# Patient Record
Sex: Female | Born: 1990 | Race: Black or African American | Hispanic: No | Marital: Single | Smoking: Never smoker
Health system: Southern US, Community
[De-identification: ages and names within clinical notes are randomized; demographics above are authoritative.]

## PROBLEM LIST (undated history)

## (undated) DIAGNOSIS — R87629 Unspecified abnormal cytological findings in specimens from vagina: Secondary | ICD-10-CM

## (undated) HISTORY — PX: NO PAST SURGERIES: SHX2092

---
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
## 2008-08-10 ENCOUNTER — Inpatient Hospital Stay (HOSPITAL_COMMUNITY): Admission: AD | Admit: 2008-08-10 | Discharge: 2008-08-10 | Payer: Self-pay | Admitting: Obstetrics & Gynecology

## 2011-07-28 LAB — GC/CHLAMYDIA PROBE AMP, GENITAL
Chlamydia, DNA Probe: NEGATIVE
GC Probe Amp, Genital: NEGATIVE

## 2011-07-28 LAB — WET PREP, GENITAL
Clue Cells Wet Prep HPF POC: NONE SEEN
Trich, Wet Prep: NONE SEEN

## 2011-07-28 LAB — ABO/RH: ABO/RH(D): A NEG

## 2011-07-28 LAB — URINALYSIS, ROUTINE W REFLEX MICROSCOPIC
Glucose, UA: NEGATIVE
Protein, ur: NEGATIVE
Specific Gravity, Urine: >1.03 — ABNORMAL HIGH
pH: 6

## 2011-07-28 LAB — HCG, QUANTITATIVE, PREGNANCY: hCG, Beta Chain, Quant, S: 99853 — ABNORMAL HIGH

## 2011-07-28 LAB — RH IMMUNE GLOBULIN WORKUP (NOT WOMEN'S HOSP)

## 2011-08-05 LAB — WET PREP, GENITAL
Trich, Wet Prep: NONE SEEN
Yeast Wet Prep HPF POC: NONE SEEN

## 2011-08-05 LAB — GC/CHLAMYDIA PROBE AMP, GENITAL: GC Probe Amp, Genital: NEGATIVE

## 2019-10-28 NOTE — L&D Delivery Note (Signed)
DELIVERY NOTE  Pt complete and at +2 station with urge to push. Epidural controlling pain. Pt pushed and delivered a viable female infant in ROA position. Anterior and posterior shoulders spontaneously delivered with next two pushes; body easily followed next. Infant placed on mothers abdomen and bulb suction of mouth and nose performed. Body cord delivered through. Cord was then clamped and cut by FOB. Cord blood obtained, 3VC. Baby had a vigorous spontaneous cry noted. Placenta then delivered at 1255 intact. Fundal massage performed and pitocin per protocol. Fundus firm. The following lacerations were noted: 1st degree. Repaired in routine fashion with 2-0 vicryl (BL periurethral abrasions, hemostatic) Mother and baby stable. Counts correct   Infant time: 1251 Gender: female Placenta time: 1255 Apgars: 9/9 Weight: pending skin-to-skin

## 2020-02-29 ENCOUNTER — Encounter (HOSPITAL_COMMUNITY): Payer: Self-pay | Admitting: *Deleted

## 2020-02-29 ENCOUNTER — Observation Stay (HOSPITAL_COMMUNITY)
Admission: AD | Admit: 2020-02-29 | Discharge: 2020-03-01 | Disposition: A | Discharge status: Home / Self Care | Payer: No Typology Code available for payment source | Attending: Obstetrics and Gynecology | Admitting: Obstetrics and Gynecology

## 2020-02-29 ENCOUNTER — Other Ambulatory Visit: Payer: Self-pay

## 2020-02-29 ENCOUNTER — Inpatient Hospital Stay (HOSPITAL_BASED_OUTPATIENT_CLINIC_OR_DEPARTMENT_OTHER): Payer: No Typology Code available for payment source

## 2020-02-29 DIAGNOSIS — O3412 Maternal care for benign tumor of corpus uteri, second trimester: Secondary | ICD-10-CM | POA: Insufficient documentation

## 2020-02-29 DIAGNOSIS — O479 False labor, unspecified: Secondary | ICD-10-CM

## 2020-02-29 DIAGNOSIS — O99891 Other specified diseases and conditions complicating pregnancy: Secondary | ICD-10-CM

## 2020-02-29 DIAGNOSIS — O9A219 Injury, poisoning and certain other consequences of external causes complicating pregnancy, unspecified trimester: Principal | ICD-10-CM | POA: Insufficient documentation

## 2020-02-29 DIAGNOSIS — Z3A26 26 weeks gestation of pregnancy: Secondary | ICD-10-CM | POA: Diagnosis not present

## 2020-02-29 DIAGNOSIS — O9A212 Injury, poisoning and certain other consequences of external causes complicating pregnancy, second trimester: Secondary | ICD-10-CM | POA: Diagnosis not present

## 2020-02-29 DIAGNOSIS — Z20822 Contact with and (suspected) exposure to covid-19: Secondary | ICD-10-CM | POA: Insufficient documentation

## 2020-02-29 DIAGNOSIS — T1490XA Injury, unspecified, initial encounter: Secondary | ICD-10-CM

## 2020-02-29 DIAGNOSIS — D259 Leiomyoma of uterus, unspecified: Secondary | ICD-10-CM

## 2020-02-29 LAB — TYPE AND SCREEN
ABO/RH(D): A NEG
Antibody Screen: NEGATIVE

## 2020-02-29 LAB — URINALYSIS, ROUTINE W REFLEX MICROSCOPIC
Bilirubin Urine: NEGATIVE
Glucose, UA: NEGATIVE mg/dL
Hgb urine dipstick: NEGATIVE
Ketones, ur: 20 mg/dL — AB
Leukocytes,Ua: NEGATIVE
Nitrite: NEGATIVE
Protein, ur: 30 mg/dL — AB
Specific Gravity, Urine: 1.027 (ref 1.005–1.030)
pH: 5 (ref 5.0–8.0)

## 2020-02-29 LAB — CBC
HCT: 33.8 % — ABNORMAL LOW (ref 36.0–46.0)
Hemoglobin: 10.8 g/dL — ABNORMAL LOW (ref 12.0–15.0)
MCH: 27.2 pg (ref 26.0–34.0)
MCHC: 32 g/dL (ref 30.0–36.0)
MCV: 85.1 fL (ref 80.0–100.0)
Platelets: 335 10*3/uL (ref 150–400)
RBC: 3.97 MIL/uL (ref 3.87–5.11)
RDW: 14 % (ref 11.5–15.5)
WBC: 8.7 10*3/uL (ref 4.0–10.5)
nRBC: 0.2 % (ref 0.0–0.2)

## 2020-02-29 LAB — KLEIHAUER-BETKE STAIN
# Vials RhIg: 1
Fetal Cells %: 0 %
Quantitation Fetal Hemoglobin: 0 mL

## 2020-02-29 LAB — RESPIRATORY PANEL BY RT PCR (FLU A&B, COVID)
Influenza A by PCR: NEGATIVE
Influenza B by PCR: NEGATIVE
SARS Coronavirus 2 by RT PCR: NEGATIVE

## 2020-02-29 MED ORDER — ZOLPIDEM TARTRATE 5 MG PO TABS
5.0000 mg | ORAL_TABLET | Freq: Every evening | ORAL | Status: DC | PRN
  Administered 2020-03-01: 5 mg via ORAL
  Filled 2020-02-29: qty 1

## 2020-02-29 MED ORDER — DOCUSATE SODIUM 100 MG PO CAPS: 100.0000 mg | ORAL_CAPSULE | Freq: Every day | ORAL | Status: DC

## 2020-02-29 MED ORDER — ACETAMINOPHEN 325 MG PO TABS: 650.0000 mg | ORAL_TABLET | ORAL | Status: DC | PRN

## 2020-02-29 MED ORDER — ACETAMINOPHEN 500 MG PO TABS
1000.0000 mg | ORAL_TABLET | Freq: Once | ORAL | Status: AC
  Administered 2020-02-29: 1000 mg via ORAL
  Filled 2020-02-29: qty 2

## 2020-02-29 MED ORDER — PRENATAL MULTIVITAMIN CH: 1.0000 | ORAL_TABLET | Freq: Every day | ORAL | Status: DC

## 2020-02-29 MED ORDER — CALCIUM CARBONATE ANTACID 500 MG PO CHEW: 2.0000 | CHEWABLE_TABLET | ORAL | Status: DC | PRN

## 2020-02-29 NOTE — MAU Note (Signed)
Covid swab obtained without difficulty and pt tol well. No symptoms 
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
## 2020-02-29 NOTE — Plan of Care (Signed)
  Problem: Education: Goal: Knowledge of General Education information will improve Description: Including pain rating scale, medication(s)/side effects and non-pharmacologic comfort measures Outcome: Completed/Met

## 2020-03-01 DIAGNOSIS — O9A219 Injury, poisoning and certain other consequences of external causes complicating pregnancy, unspecified trimester: Secondary | ICD-10-CM | POA: Diagnosis not present

## 2020-03-01 LAB — APTT: aPTT: 30 seconds (ref 24–36)

## 2020-03-01 LAB — PROTIME-INR
INR: 1.1 (ref 0.8–1.2)
Prothrombin Time: 14 seconds (ref 11.4–15.2)

## 2020-03-01 LAB — ABO/RH: ABO/RH(D): A NEG

## 2020-03-01 MED ORDER — ACETAMINOPHEN 325 MG PO TABS: 650.0000 mg | ORAL_TABLET | ORAL | 1 refills | Status: AC | PRN

## 2020-03-01 NOTE — Plan of Care (Signed)
Pt to be discharged with printed instructions. No concerns noted. Toya Smothers, RN
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
## 2020-03-01 NOTE — Discharge Instructions (Signed)
Call office with any concerns [phone] 
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
## 2020-05-28 ENCOUNTER — Other Ambulatory Visit: Payer: Self-pay

## 2020-05-28 ENCOUNTER — Inpatient Hospital Stay (HOSPITAL_COMMUNITY)
Admission: AD | Admit: 2020-05-28 | Discharge: 2020-05-31 | DRG: 807 | Disposition: A | Discharge status: Home / Self Care | Payer: No Typology Code available for payment source | Attending: Obstetrics and Gynecology | Admitting: Obstetrics and Gynecology

## 2020-05-28 ENCOUNTER — Encounter (HOSPITAL_COMMUNITY): Payer: Self-pay | Admitting: Obstetrics and Gynecology

## 2020-05-28 DIAGNOSIS — I9581 Postprocedural hypotension: Secondary | ICD-10-CM | POA: Diagnosis not present

## 2020-05-28 DIAGNOSIS — Z20822 Contact with and (suspected) exposure to covid-19: Secondary | ICD-10-CM | POA: Diagnosis present

## 2020-05-28 DIAGNOSIS — Z3A39 39 weeks gestation of pregnancy: Secondary | ICD-10-CM

## 2020-05-28 DIAGNOSIS — O4292 Full-term premature rupture of membranes, unspecified as to length of time between rupture and onset of labor: Principal | ICD-10-CM | POA: Diagnosis present

## 2020-05-28 DIAGNOSIS — E669 Obesity, unspecified: Secondary | ICD-10-CM | POA: Diagnosis present

## 2020-05-28 DIAGNOSIS — O99214 Obesity complicating childbirth: Secondary | ICD-10-CM | POA: Diagnosis present

## 2020-05-28 HISTORY — DX: Unspecified abnormal cytological findings in specimens from vagina: R87.629

## 2020-05-28 LAB — POCT FERN TEST: POCT Fern Test: POSITIVE

## 2020-05-28 MED ORDER — LACTATED RINGERS IV SOLN: INTRAVENOUS | Status: DC

## 2020-05-28 NOTE — MAU Note (Signed)
Pt reports water broke at 1030p, clear/pink fluid. Reports a few mild, irregular contractions. No vaginal bleeding. Good movement. Cervix 2cm on last exam.

## 2020-05-29 ENCOUNTER — Inpatient Hospital Stay (HOSPITAL_COMMUNITY): Payer: No Typology Code available for payment source | Admitting: Anesthesiology

## 2020-05-29 ENCOUNTER — Encounter (HOSPITAL_COMMUNITY): Payer: Self-pay | Admitting: Obstetrics and Gynecology

## 2020-05-29 ENCOUNTER — Other Ambulatory Visit: Payer: Self-pay

## 2020-05-29 DIAGNOSIS — Z20822 Contact with and (suspected) exposure to covid-19: Secondary | ICD-10-CM | POA: Diagnosis present

## 2020-05-29 DIAGNOSIS — I9581 Postprocedural hypotension: Secondary | ICD-10-CM | POA: Diagnosis not present

## 2020-05-29 DIAGNOSIS — E669 Obesity, unspecified: Secondary | ICD-10-CM | POA: Diagnosis present

## 2020-05-29 DIAGNOSIS — Z3A39 39 weeks gestation of pregnancy: Secondary | ICD-10-CM | POA: Diagnosis not present

## 2020-05-29 DIAGNOSIS — O4292 Full-term premature rupture of membranes, unspecified as to length of time between rupture and onset of labor: Secondary | ICD-10-CM | POA: Diagnosis present

## 2020-05-29 DIAGNOSIS — O99214 Obesity complicating childbirth: Secondary | ICD-10-CM | POA: Diagnosis present

## 2020-05-29 LAB — CBC
HCT: 36.1 % (ref 36.0–46.0)
Hemoglobin: 11 g/dL — ABNORMAL LOW (ref 12.0–15.0)
MCH: 25.1 pg — ABNORMAL LOW (ref 26.0–34.0)
MCHC: 30.5 g/dL (ref 30.0–36.0)
MCV: 82.2 fL (ref 80.0–100.0)
Platelets: 340 10*3/uL (ref 150–400)
RBC: 4.39 MIL/uL (ref 3.87–5.11)
RDW: 17.1 % — ABNORMAL HIGH (ref 11.5–15.5)
WBC: 8.7 10*3/uL (ref 4.0–10.5)
nRBC: 0 % (ref 0.0–0.2)

## 2020-05-29 LAB — TYPE AND SCREEN
ABO/RH(D): A NEG
Antibody Screen: NEGATIVE

## 2020-05-29 LAB — OB RESULTS CONSOLE GC/CHLAMYDIA
Chlamydia: NEGATIVE
Gonorrhea: NEGATIVE

## 2020-05-29 LAB — OB RESULTS CONSOLE RUBELLA ANTIBODY, IGM: Rubella: IMMUNE

## 2020-05-29 LAB — OB RESULTS CONSOLE HIV ANTIBODY (ROUTINE TESTING): HIV: NONREACTIVE

## 2020-05-29 LAB — OB RESULTS CONSOLE HEPATITIS B SURFACE ANTIGEN: Hepatitis B Surface Ag: NEGATIVE

## 2020-05-29 LAB — RPR: RPR Ser Ql: NONREACTIVE

## 2020-05-29 LAB — OB RESULTS CONSOLE RPR: RPR: NONREACTIVE

## 2020-05-29 LAB — OB RESULTS CONSOLE GBS: GBS: NEGATIVE

## 2020-05-29 LAB — SARS CORONAVIRUS 2 BY RT PCR (HOSPITAL ORDER, PERFORMED IN ~~LOC~~ HOSPITAL LAB): SARS Coronavirus 2: NEGATIVE

## 2020-05-29 MED ORDER — LACTATED RINGERS IV SOLN
500.0000 mL | INTRAVENOUS | Status: DC | PRN
  Administered 2020-05-29: 800 mL via INTRAVENOUS

## 2020-05-29 MED ORDER — PHENYLEPHRINE 40 MCG/ML (10ML) SYRINGE FOR IV PUSH (FOR BLOOD PRESSURE SUPPORT)
80.0000 ug | PREFILLED_SYRINGE | INTRAVENOUS | Status: AC | PRN
  Administered 2020-05-29 (×3): 80 ug via INTRAVENOUS

## 2020-05-29 MED ORDER — OXYTOCIN BOLUS FROM INFUSION
333.0000 mL | Freq: Once | INTRAVENOUS | Status: AC
  Administered 2020-05-29: 333 mL via INTRAVENOUS

## 2020-05-29 MED ORDER — OXYCODONE-ACETAMINOPHEN 5-325 MG PO TABS: 2.0000 | ORAL_TABLET | ORAL | Status: DC | PRN

## 2020-05-29 MED ORDER — ACETAMINOPHEN 325 MG PO TABS: 650.0000 mg | ORAL_TABLET | ORAL | Status: DC | PRN

## 2020-05-29 MED ORDER — EPHEDRINE 5 MG/ML INJ
INTRAVENOUS | Status: AC
  Administered 2020-05-29: 5 mg
  Filled 2020-05-29: qty 10

## 2020-05-29 MED ORDER — SODIUM CHLORIDE (PF) 0.9 % IJ SOLN
INTRAMUSCULAR | Status: DC | PRN
  Administered 2020-05-29: 10.5 mL/h via EPIDURAL

## 2020-05-29 MED ORDER — OXYTOCIN-SODIUM CHLORIDE 30-0.9 UT/500ML-% IV SOLN: 2.5000 [IU]/h | INTRAVENOUS | Status: DC

## 2020-05-29 MED ORDER — SOD CITRATE-CITRIC ACID 500-334 MG/5ML PO SOLN: 30.0000 mL | ORAL | Status: DC | PRN

## 2020-05-29 MED ORDER — PRENATAL MULTIVITAMIN CH
1.0000 | ORAL_TABLET | Freq: Every day | ORAL | Status: DC
  Administered 2020-05-30 – 2020-05-31 (×2): 1 via ORAL
  Filled 2020-05-29 (×2): qty 1

## 2020-05-29 MED ORDER — LIDOCAINE HCL (PF) 1 % IJ SOLN: 30.0000 mL | INTRAMUSCULAR | Status: DC | PRN

## 2020-05-29 MED ORDER — WITCH HAZEL-GLYCERIN EX PADS: 1.0000 "application " | MEDICATED_PAD | CUTANEOUS | Status: DC | PRN

## 2020-05-29 MED ORDER — TERBUTALINE SULFATE 1 MG/ML IJ SOLN: 0.2500 mg | Freq: Once | INTRAMUSCULAR | Status: DC | PRN

## 2020-05-29 MED ORDER — EPHEDRINE 5 MG/ML INJ: 10.0000 mg | INTRAVENOUS | Status: DC | PRN

## 2020-05-29 MED ORDER — ONDANSETRON HCL 4 MG/2ML IJ SOLN
4.0000 mg | Freq: Four times a day (QID) | INTRAMUSCULAR | Status: DC | PRN
  Administered 2020-05-29: 4 mg via INTRAVENOUS
  Filled 2020-05-29: qty 2

## 2020-05-29 MED ORDER — ONDANSETRON HCL 4 MG PO TABS: 4.0000 mg | ORAL_TABLET | ORAL | Status: DC | PRN

## 2020-05-29 MED ORDER — ONDANSETRON HCL 4 MG/2ML IJ SOLN: 4.0000 mg | INTRAMUSCULAR | Status: DC | PRN

## 2020-05-29 MED ORDER — DIPHENHYDRAMINE HCL 25 MG PO CAPS: 25.0000 mg | ORAL_CAPSULE | Freq: Four times a day (QID) | ORAL | Status: DC | PRN

## 2020-05-29 MED ORDER — LACTATED RINGERS IV SOLN
500.0000 mL | Freq: Once | INTRAVENOUS | Status: AC
  Administered 2020-05-29: 500 mL via INTRAVENOUS

## 2020-05-29 MED ORDER — IBUPROFEN 600 MG PO TABS
600.0000 mg | ORAL_TABLET | Freq: Four times a day (QID) | ORAL | Status: DC
  Administered 2020-05-29 – 2020-05-31 (×7): 600 mg via ORAL
  Filled 2020-05-29 (×7): qty 1

## 2020-05-29 MED ORDER — SIMETHICONE 80 MG PO CHEW: 80.0000 mg | CHEWABLE_TABLET | ORAL | Status: DC | PRN

## 2020-05-29 MED ORDER — OXYTOCIN-SODIUM CHLORIDE 30-0.9 UT/500ML-% IV SOLN
1.0000 m[IU]/min | INTRAVENOUS | Status: DC
  Administered 2020-05-29: 2 m[IU]/min via INTRAVENOUS
  Filled 2020-05-29: qty 500

## 2020-05-29 MED ORDER — COCONUT OIL OIL
1.0000 "application " | TOPICAL_OIL | Status: DC | PRN
  Administered 2020-05-30: 1 via TOPICAL

## 2020-05-29 MED ORDER — PHENYLEPHRINE 40 MCG/ML (10ML) SYRINGE FOR IV PUSH (FOR BLOOD PRESSURE SUPPORT)
80.0000 ug | PREFILLED_SYRINGE | INTRAVENOUS | Status: DC | PRN
  Administered 2020-05-29 (×2): 80 ug via INTRAVENOUS
  Filled 2020-05-29: qty 10

## 2020-05-29 MED ORDER — BUTORPHANOL TARTRATE 1 MG/ML IJ SOLN
1.0000 mg | INTRAMUSCULAR | Status: DC | PRN
  Administered 2020-05-29 (×2): 1 mg via INTRAVENOUS
  Filled 2020-05-29 (×2): qty 1

## 2020-05-29 MED ORDER — EPHEDRINE 5 MG/ML INJ
10.0000 mg | INTRAVENOUS | Status: DC | PRN
  Administered 2020-05-29: 10 mg via INTRAVENOUS

## 2020-05-29 MED ORDER — DIBUCAINE (PERIANAL) 1 % EX OINT: 1.0000 "application " | TOPICAL_OINTMENT | CUTANEOUS | Status: DC | PRN

## 2020-05-29 MED ORDER — BENZOCAINE-MENTHOL 20-0.5 % EX AERO
1.0000 "application " | INHALATION_SPRAY | CUTANEOUS | Status: DC | PRN
  Administered 2020-05-29: 1 via TOPICAL
  Filled 2020-05-29: qty 56

## 2020-05-29 MED ORDER — ZOLPIDEM TARTRATE 5 MG PO TABS: 5.0000 mg | ORAL_TABLET | Freq: Every evening | ORAL | Status: DC | PRN

## 2020-05-29 MED ORDER — DIPHENHYDRAMINE HCL 50 MG/ML IJ SOLN: 12.5000 mg | INTRAMUSCULAR | Status: DC | PRN

## 2020-05-29 MED ORDER — OXYCODONE-ACETAMINOPHEN 5-325 MG PO TABS: 1.0000 | ORAL_TABLET | ORAL | Status: DC | PRN

## 2020-05-29 MED ORDER — FENTANYL-BUPIVACAINE-NACL 0.5-0.125-0.9 MG/250ML-% EP SOLN
12.0000 mL/h | EPIDURAL | Status: DC | PRN
  Filled 2020-05-29: qty 250

## 2020-05-29 MED ORDER — TETANUS-DIPHTH-ACELL PERTUSSIS 5-2.5-18.5 LF-MCG/0.5 IM SUSP: 0.5000 mL | Freq: Once | INTRAMUSCULAR | Status: DC

## 2020-05-29 MED ORDER — LIDOCAINE HCL (PF) 1 % IJ SOLN
INTRAMUSCULAR | Status: DC | PRN
  Administered 2020-05-29: 3 mL via EPIDURAL
  Administered 2020-05-29: 4 mL via EPIDURAL

## 2020-05-29 MED ORDER — SENNOSIDES-DOCUSATE SODIUM 8.6-50 MG PO TABS
2.0000 | ORAL_TABLET | ORAL | Status: DC
  Administered 2020-05-29 – 2020-05-31 (×2): 2 via ORAL
  Filled 2020-05-29 (×2): qty 2

## 2020-05-29 NOTE — Plan of Care (Signed)

## 2020-05-29 NOTE — Progress Notes (Signed)
Patient now s/p epidural, required ephedrine for post-procedural hypotension, no normotensive, pain easing. CE unchanged at 3-4/75/-2. Light meconium on exam. Amenable to initiate pitocin at this time, order already in place
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
## 2020-05-29 NOTE — Progress Notes (Signed)
Patient now complete/+1, good effort with practice pushing. Will move forward at this time

## 2020-05-29 NOTE — Anesthesia Procedure Notes (Signed)
Epidural Patient location during procedure: OB Start time: 05/29/2020 8:41 AM End time: 05/29/2020 8:48 AM  Staffing Anesthesiologist: Josephine Igo, MD Performed: anesthesiologist   Preanesthetic Checklist Completed: patient identified, IV checked, site marked, risks and benefits discussed, surgical consent, monitors and equipment checked, pre-op evaluation and timeout performed  Epidural Patient position: sitting Prep: DuraPrep and site prepped and draped Patient monitoring: continuous pulse ox and blood pressure Approach: midline Location: L3-L4 Injection technique: LOR saline  Needle:  Needle type: Tuohy  Needle gauge: 17 G Needle length: 9 cm and 9 Needle insertion depth: 5 cm cm Catheter type: closed end flexible Catheter size: 19 Gauge Catheter at skin depth: 10 cm Test dose: negative and Other  Assessment Events: blood not aspirated, injection not painful, no injection resistance, no paresthesia and negative IV test  Additional Notes Patient identified. Risks and benefits discussed including failed block, incomplete  Pain control, post dural puncture headache, nerve damage, paralysis, blood pressure Changes, nausea, vomiting, reactions to medications-both toxic and allergic and post Partum back pain. All questions were answered. Patient expressed understanding and wished to proceed. Sterile technique was used throughout procedure. Epidural site was Dressed with sterile barrier dressing. No paresthesias, signs of intravascular injection Or signs of intrathecal spread were encountered.  Patient was more comfortable after the epidural was dosed. Please see RN's note for documentation of vital signs and FHR which are stable. Reason for block:procedure for pain

## 2020-05-29 NOTE — Anesthesia Preprocedure Evaluation (Signed)
Anesthesia Evaluation  Patient identified by MRN, date of birth, ID band Patient awake    Reviewed: Allergy & Precautions, H&P , Patient's Chart, lab work & pertinent test results  Airway Mallampati: II  TM Distance: >3 FB Neck ROM: full    Dental no notable dental hx. (+) Teeth Intact   Pulmonary neg pulmonary ROS,    Pulmonary exam normal breath sounds clear to auscultation       Cardiovascular negative cardio ROS Normal cardiovascular exam Rhythm:regular Rate:Normal     Neuro/Psych negative neurological ROS  negative psych ROS   GI/Hepatic negative GI ROS, Neg liver ROS,   Endo/Other  Obesity  Renal/GU negative Renal ROS  negative genitourinary   Musculoskeletal   Abdominal   Peds  Hematology  (+) Blood dyscrasia, anemia ,   Anesthesia Other Findings   Reproductive/Obstetrics (+) Pregnancy                             Anesthesia Physical Anesthesia Plan  ASA: II  Anesthesia Plan: Epidural   Post-op Pain Management:    Induction:   PONV Risk Score and Plan:   Airway Management Planned:   Additional Equipment:   Intra-op Plan:   Post-operative Plan:   Informed Consent: I have reviewed the patients History and Physical, chart, labs and discussed the procedure including the risks, benefits and alternatives for the proposed anesthesia with the patient or authorized representative who has indicated his/her understanding and acceptance.       Plan Discussed with: Anesthesiologist  Anesthesia Plan Comments:         Anesthesia Quick Evaluation

## 2020-05-29 NOTE — Progress Notes (Signed)
Patient now requesting epidural. Category 1 tracing, TOCO q3-4, last CE 3-4 @ 0600. Reviewed time of SROM @ 2030 and concerns regarding prolonged ROM including infection risk. Patient amenable to starting pitocin if, after epidural is placed, she still has not changed. Anesthesia notified regarding epidural.   BP 110/68   Pulse 91   Temp 97.8 F (36.6 C) (Oral)   Resp 17   Ht 4\' 11"  (1.499 m)   Wt 80.2 kg   LMP 08/26/2019   BMI 35.73 kg/m

## 2020-05-30 LAB — CBC
HCT: 30 % — ABNORMAL LOW (ref 36.0–46.0)
Hemoglobin: 9 g/dL — ABNORMAL LOW (ref 12.0–15.0)
MCH: 25 pg — ABNORMAL LOW (ref 26.0–34.0)
MCHC: 30 g/dL (ref 30.0–36.0)
MCV: 83.3 fL (ref 80.0–100.0)
Platelets: 281 10*3/uL (ref 150–400)
RBC: 3.6 MIL/uL — ABNORMAL LOW (ref 3.87–5.11)
RDW: 17.2 % — ABNORMAL HIGH (ref 11.5–15.5)
WBC: 12.3 10*3/uL — ABNORMAL HIGH (ref 4.0–10.5)
nRBC: 0 % (ref 0.0–0.2)

## 2020-05-30 MED ORDER — RHO D IMMUNE GLOBULIN 1500 UNIT/2ML IJ SOSY
300.0000 ug | PREFILLED_SYRINGE | Freq: Once | INTRAMUSCULAR | Status: AC
  Administered 2020-05-30: 300 ug via INTRAVENOUS
  Filled 2020-05-30: qty 2

## 2020-05-30 NOTE — Progress Notes (Signed)
Post Partum Day 1 Subjective: no complaints, up ad lib, voiding, tolerating PO, + flatus and lochia mild. Pt reports fatigue due to limited sleep overnight. She is bonding well wtih baby but has some nipple discomfort with breastfeeding. She deneis HA, CP, SOB. Discussed circumcision later today  Objective: Blood pressure 108/74, pulse 85, temperature 98.3 F (36.8 C), resp. rate 18, height 4\' 11"  (1.499 m), weight 80.2 kg, last menstrual period 08/26/2019, SpO2 100 %, unknown if currently breastfeeding.  Physical Exam:  General: alert, cooperative and no distress Lochia: appropriate Uterine Fundus: firm Incision: n/a DVT Evaluation: No evidence of DVT seen on physical exam. Negative Homan's sign. No significant calf/ankle edema.  Recent Labs    05/28/20 2358 05/30/20 0702  HGB 11.0* 9.0*  HCT 36.1 30.0*    Assessment/Plan: Plan for discharge tomorrow, Breastfeeding and Lactation consult  Circumcision today   LOS: 1 day   Ozias Dicenzo W Karlita Lichtman 05/30/2020, 8:41 AM

## 2020-05-30 NOTE — Lactation Note (Signed)
This note was copied from a baby's chart. Lactation Consultation Note  Patient Name: Bridget Baxter [id number] Date: 05/30/2020 Reason for consult: Initial assessment;1st time breastfeeding;Term Baby 106 hours old, mom sitting on bed breastfeeding baby at left breast cradle position with onsie on, states baby fed on right breast for 55mins then switched to left.  Mom's concerns: - unsure if baby is latched correctly d/t occasional nipple pain - help with latching in football hold Mom's goal: - to breastfeed, unsure of how long at this time Mom reports feedings at the breast last on average 6-53mins, denies breastfeeding first baby (now 47yrs old), states baby would not latch. Assisted with latching baby deep to right breast football hold, mom reports noting difference in latch, denies pain, breast tissue movement noted, audible swallows noted, baby released breast on own ~93min mark, upon release nipple is round and without damage. Mom reports using coconut oil on nipples, reports improvement with nipple pain. Mom plans to purchase a DEBP.This LC taught hand expression, drops expressed. Advised cue based feedings, cluster feeding, optimal skin to skin and benefits, average length of feeding, engorgement and how to manage (hand pump given). Advised notify RN if with breastfeeding concerns, no LC overnight, will f/u in the AM. Mom voiced understanding and with no further concerns. BGilliam, RN, IBCLC  Maternal Data Formula Feeding for Exclusion: No Has patient been taught Hand Expression?: Yes Does the patient have breastfeeding experience prior to this delivery?: No  Feeding Feeding Type: Breast Fed  LATCH Score Latch: Grasps breast easily, tongue down, lips flanged, rhythmical sucking.  Audible Swallowing: A few with stimulation  Type of Nipple: Everted at rest and after stimulation  Comfort (Breast/Nipple): Filling, red/small blisters or bruises, mild/mod discomfort  Hold (Positioning):  Assistance needed to correctly position infant at breast and maintain latch.  LATCH Score: 7  Interventions Interventions: Breast feeding basics reviewed;Assisted with latch;Breast compression;Adjust position;Support pillows;Position options;Expressed milk  Lactation Tools Discussed/Used WIC Program: No   Consult Status Consult Status: Follow-up Date: 05/31/20 Follow-up type: In-patient    Bridget Baxter 05/30/2020, 9:19 PM

## 2020-05-30 NOTE — Anesthesia Postprocedure Evaluation (Signed)
Anesthesia Post Note  Patient: Bridget Baxter  Procedure(s) Performed: AN AD [place]     Patient location during evaluation: Mother Baby Anesthesia Type: Epidural Level of consciousness: awake and alert and oriented Pain management: satisfactory to patient Vital Signs Assessment: post-procedure vital signs reviewed and stable Respiratory status: respiratory function stable Cardiovascular status: stable Postop Assessment: no headache, no backache, epidural receding, patient able to bend at knees, no signs of nausea or vomiting, adequate PO intake and able to ambulate Anesthetic complications: no   No complications documented.  Last Vitals:  Vitals:   05/30/20 0001 05/30/20 0539  BP: 107/70 108/74  Pulse: 93 85  Resp: 18 18  Temp: 36.8 C   SpO2: 100% 100%    Last Pain:  Vitals:   05/30/20 0536  TempSrc:   PainSc: 0-No pain   Pain Goal: Patients Stated Pain Goal: 2 (05/29/20 2000)                 Katherina Mires

## 2020-05-31 LAB — RH IG WORKUP (INCLUDES ABO/RH)
ABO/RH(D): A NEG
Fetal Screen: NEGATIVE
Gestational Age(Wks): 39.4
Unit division: 0

## 2020-05-31 MED ORDER — IBUPROFEN 600 MG PO TABS: 600.0000 mg | ORAL_TABLET | Freq: Four times a day (QID) | ORAL | 0 refills | Status: AC

## 2020-05-31 NOTE — Progress Notes (Signed)
Post Partum Day 2 Subjective: no complaints, up ad lib and tolerating PO, sleepy  Objective: Blood pressure 107/69, pulse 90, temperature 98.3 F (36.8 C), temperature source Oral, resp. rate 18, height 4\' 11"  (1.499 m), weight 80.2 kg, last menstrual period 08/26/2019, SpO2 100 %, unknown if currently breastfeeding.  Physical Exam:  General: alert and cooperative Lochia: appropriate Uterine Fundus: firm   Recent Labs    05/28/20 2358 05/30/20 0702  HGB 11.0* 9.0*  HCT 36.1 30.0*    Assessment/Plan: Discharge home  Routine f/u, instructions reviewed   LOS: 2 days   Logan Bores 05/31/2020, 9:36 AM

## 2020-05-31 NOTE — Lactation Note (Signed)
This note was copied from a baby's chart. Lactation Consultation Note  Patient Name: Bridget Baxter [id number]'M Date: 05/31/2020 Reason for consult: Follow-up assessment   P2 mother whose infant is now 8 hours old.  This is a term baby at 39+4 weeks.    Baby had just finished feeding when I arrived.  Mother had no questions/concerns related to breast feeding.  Discussed engorgement prevention/treatment.  Mother had a manual pump at bedside but stated it did not feel right.  Checked the flange size and changed her from a #24 flange size to a #27 flange size for greater fit and comfort.  Instructed in the use of coconut oil to nipples/areolas and to use inside the flange for comfort as needed.  Mother appreciative.  Mother will continue feeding 8-12 times/24 hours or sooner if baby shows cues.  She has a DEBP at home and is also obtaining another DEBP for home use.  No support person present at this time.  Mother has our OP phone number for any concerns after discharge.   Maternal Data    Feeding Feeding Type: Breast Fed  LATCH Score                   Interventions    Lactation Tools Discussed/Used     Consult Status Consult Status: Complete Date: 05/31/20 Follow-up type: In-patient    Bridget Baxter 05/31/2020, 10:18 AM

## 2020-05-31 NOTE — Discharge Summary (Signed)
Postpartum Discharge Summary       Patient Name: Bridget Baxter DOB: [birth year] MRN: [id number]  Date of admission: 05/28/2020 Delivery date:05/29/2020  Delivering provider: Deliah Boston  Date of discharge: 05/31/2020  Admitting diagnosis: Indication for care in labor or delivery [O75.9] Intrauterine pregnancy: [redacted]w[redacted]d     Secondary diagnosis:  Active Problems:   Indication for care in labor or delivery  Additional problems: none    Discharge diagnosis: Term Pregnancy Delivered                                              Post partum procedures:rhogam Augmentation: Pitocin Complications: None  Hospital course: Onset of Labor With Vaginal Delivery      29 y.o. yo [id number] at [redacted]w[redacted]d was admitted in Latent Labor on 05/28/2020. Patient had an uncomplicated labor course as follows:  Membrane Rupture Time/Date: 10:30 PM ,05/28/2020   Delivery Method:Vaginal, Spontaneous  Episiotomy: None  Lacerations:  1st degree;Perineal  Patient had an uncomplicated postpartum course.  She is ambulating, tolerating a regular diet, passing flatus, and urinating well. Patient is discharged home in stable condition on 05/31/20.  Newborn Data: Birth date:05/29/2020  Birth time:12:51 PM  Gender:Female  Living status:Living  Apgars:9 ,9  Weight:3425 g   Magnesium Sulfate received: No BMZ received: No Rhophylac:Yes   Physical exam  Vitals:   05/30/20 0539 05/30/20 1436 05/30/20 2211 05/31/20 0500  BP: 108/74 105/76 113/80 107/69  Pulse: 85 83 93 90  Resp: 18 16 18 18   Temp:  98.2 F (36.8 C) 98.3 F (36.8 C) 98.3 F (36.8 C)  TempSrc:  Oral Oral Oral  SpO2: 100% 100%    Weight:      Height:       General: alert and cooperative Lochia: appropriate Uterine Fundus: firm  Labs: Lab Results  Component Value Date   WBC 12.3 (H) 05/30/2020   HGB 9.0 (L) 05/30/2020   HCT 30.0 (L) 05/30/2020   MCV 83.3 05/30/2020   PLT 281 05/30/2020   No flowsheet data found. Edinburgh  Score: Edinburgh Postnatal Depression Scale Screening Tool 05/30/2020  I have been able to laugh and see the funny side of things. 0  I have looked forward with enjoyment to things. 0  I have blamed myself unnecessarily when things went wrong. 1  I have been anxious or worried for no good reason. 1  I have felt scared or panicky for no good reason. 1  Things have been getting on top of me. 0  I have been so unhappy that I have had difficulty sleeping. 0  I have felt sad or miserable. 0  I have been so unhappy that I have been crying. 0  The thought of harming myself has occurred to me. 0  Edinburgh Postnatal Depression Scale Total 3     After visit meds:  Allergies as of 05/31/2020   No Known Allergies     Medication List    TAKE these medications   acetaminophen 325 MG tablet Commonly known as: TYLENOL Take 2 tablets (650 mg total) by mouth every 4 (four) hours as needed for moderate pain (for pain scale < 4  OR  temperature  >/=  100.5 F).   ibuprofen 600 MG tablet Commonly known as: ADVIL Take 1 tablet (600 mg total) by mouth every 6 (six) hours.  Discharge home in stable condition Infant Feeding: Breast Infant Disposition:home with mother Discharge instruction: per After Visit Summary and Postpartum booklet. Activity: Advance as tolerated. Pelvic rest for 6 weeks.  Diet: routine diet Future Appointments:No future appointments. Follow up Visit:  Follow-up Information    Shivaji, Melida Quitter, MD. Schedule an appointment as soon as possible for a visit in 6 week(s).   Specialty: Obstetrics and Gynecology Why: postpartum Contact information: [address] [place] [address] [phone]                Please schedule this patient for a In person postpartum visit in 6 weeks with the following provider: MD.  Delivery mode:  Vaginal, Spontaneous  Anticipated Birth Control:  Unsure   05/31/2020 Logan Bores, MD
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
